# Patient Record
Sex: Male | Born: 1980
Health system: Southern US, Community
[De-identification: ages and names within clinical notes are randomized; demographics above are authoritative.]

---

## 2018-11-02 DIAGNOSIS — F909 Attention-deficit hyperactivity disorder, unspecified type: Secondary | ICD-10-CM | POA: Diagnosis not present

## 2018-11-06 DIAGNOSIS — H01004 Unspecified blepharitis left upper eyelid: Secondary | ICD-10-CM | POA: Diagnosis not present

## 2018-11-06 DIAGNOSIS — H01005 Unspecified blepharitis left lower eyelid: Secondary | ICD-10-CM | POA: Diagnosis not present

## 2018-11-06 DIAGNOSIS — H15102 Unspecified episcleritis, left eye: Secondary | ICD-10-CM | POA: Diagnosis not present

## 2018-11-16 DIAGNOSIS — Z713 Dietary counseling and surveillance: Secondary | ICD-10-CM | POA: Diagnosis not present

## 2018-11-16 DIAGNOSIS — F909 Attention-deficit hyperactivity disorder, unspecified type: Secondary | ICD-10-CM | POA: Diagnosis not present

## 2018-11-17 DIAGNOSIS — H16292 Other keratoconjunctivitis, left eye: Secondary | ICD-10-CM | POA: Diagnosis not present

## 2018-11-30 DIAGNOSIS — F909 Attention-deficit hyperactivity disorder, unspecified type: Secondary | ICD-10-CM | POA: Diagnosis not present

## 2018-12-14 DIAGNOSIS — F909 Attention-deficit hyperactivity disorder, unspecified type: Secondary | ICD-10-CM | POA: Diagnosis not present

## 2018-12-27 DIAGNOSIS — Z713 Dietary counseling and surveillance: Secondary | ICD-10-CM | POA: Diagnosis not present

## 2018-12-28 DIAGNOSIS — F909 Attention-deficit hyperactivity disorder, unspecified type: Secondary | ICD-10-CM | POA: Diagnosis not present

## 2019-01-11 DIAGNOSIS — F909 Attention-deficit hyperactivity disorder, unspecified type: Secondary | ICD-10-CM | POA: Diagnosis not present

## 2019-01-25 DIAGNOSIS — Z713 Dietary counseling and surveillance: Secondary | ICD-10-CM | POA: Diagnosis not present

## 2019-01-25 DIAGNOSIS — F909 Attention-deficit hyperactivity disorder, unspecified type: Secondary | ICD-10-CM | POA: Diagnosis not present

## 2019-02-06 DIAGNOSIS — K648 Other hemorrhoids: Secondary | ICD-10-CM | POA: Diagnosis not present

## 2019-02-06 DIAGNOSIS — H5712 Ocular pain, left eye: Secondary | ICD-10-CM | POA: Diagnosis not present

## 2019-02-08 DIAGNOSIS — F909 Attention-deficit hyperactivity disorder, unspecified type: Secondary | ICD-10-CM | POA: Diagnosis not present

## 2019-02-13 DIAGNOSIS — I788 Other diseases of capillaries: Secondary | ICD-10-CM | POA: Diagnosis not present

## 2019-02-22 DIAGNOSIS — F909 Attention-deficit hyperactivity disorder, unspecified type: Secondary | ICD-10-CM | POA: Diagnosis not present

## 2019-03-01 DIAGNOSIS — Z713 Dietary counseling and surveillance: Secondary | ICD-10-CM | POA: Diagnosis not present

## 2019-03-02 DIAGNOSIS — K921 Melena: Secondary | ICD-10-CM | POA: Diagnosis not present

## 2019-03-02 DIAGNOSIS — Z800 Family history of malignant neoplasm of digestive organs: Secondary | ICD-10-CM | POA: Diagnosis not present

## 2019-03-08 DIAGNOSIS — F909 Attention-deficit hyperactivity disorder, unspecified type: Secondary | ICD-10-CM | POA: Diagnosis not present

## 2019-03-21 DIAGNOSIS — K635 Polyp of colon: Secondary | ICD-10-CM | POA: Diagnosis not present

## 2019-03-21 DIAGNOSIS — K921 Melena: Secondary | ICD-10-CM | POA: Diagnosis not present

## 2019-03-21 DIAGNOSIS — Z8371 Family history of colonic polyps: Secondary | ICD-10-CM | POA: Diagnosis not present

## 2019-03-22 DIAGNOSIS — F909 Attention-deficit hyperactivity disorder, unspecified type: Secondary | ICD-10-CM | POA: Diagnosis not present

## 2019-04-03 DIAGNOSIS — Z713 Dietary counseling and surveillance: Secondary | ICD-10-CM | POA: Diagnosis not present

## 2019-04-05 DIAGNOSIS — F909 Attention-deficit hyperactivity disorder, unspecified type: Secondary | ICD-10-CM | POA: Diagnosis not present

## 2019-04-09 DIAGNOSIS — Z0000 Encounter for general adult medical examination without abnormal findings: Secondary | ICD-10-CM | POA: Diagnosis not present

## 2019-04-09 DIAGNOSIS — H15102 Unspecified episcleritis, left eye: Secondary | ICD-10-CM | POA: Diagnosis not present

## 2019-04-09 DIAGNOSIS — F419 Anxiety disorder, unspecified: Secondary | ICD-10-CM | POA: Diagnosis not present

## 2019-04-09 DIAGNOSIS — F330 Major depressive disorder, recurrent, mild: Secondary | ICD-10-CM | POA: Diagnosis not present

## 2019-04-19 DIAGNOSIS — F419 Anxiety disorder, unspecified: Secondary | ICD-10-CM | POA: Diagnosis not present

## 2019-04-19 DIAGNOSIS — Z0000 Encounter for general adult medical examination without abnormal findings: Secondary | ICD-10-CM | POA: Diagnosis not present

## 2019-04-19 DIAGNOSIS — F909 Attention-deficit hyperactivity disorder, unspecified type: Secondary | ICD-10-CM | POA: Diagnosis not present

## 2019-04-19 DIAGNOSIS — E291 Testicular hypofunction: Secondary | ICD-10-CM | POA: Diagnosis not present

## 2019-04-19 DIAGNOSIS — F341 Dysthymic disorder: Secondary | ICD-10-CM | POA: Diagnosis not present

## 2019-05-01 DIAGNOSIS — F411 Generalized anxiety disorder: Secondary | ICD-10-CM | POA: Diagnosis not present

## 2019-05-01 DIAGNOSIS — Z713 Dietary counseling and surveillance: Secondary | ICD-10-CM | POA: Diagnosis not present

## 2019-05-03 DIAGNOSIS — F909 Attention-deficit hyperactivity disorder, unspecified type: Secondary | ICD-10-CM | POA: Diagnosis not present

## 2019-05-17 DIAGNOSIS — F909 Attention-deficit hyperactivity disorder, unspecified type: Secondary | ICD-10-CM | POA: Diagnosis not present

## 2019-05-29 DIAGNOSIS — F411 Generalized anxiety disorder: Secondary | ICD-10-CM | POA: Diagnosis not present

## 2019-05-29 DIAGNOSIS — Z713 Dietary counseling and surveillance: Secondary | ICD-10-CM | POA: Diagnosis not present

## 2019-05-31 DIAGNOSIS — F909 Attention-deficit hyperactivity disorder, unspecified type: Secondary | ICD-10-CM | POA: Diagnosis not present

## 2019-06-20 DIAGNOSIS — H15002 Unspecified scleritis, left eye: Secondary | ICD-10-CM | POA: Diagnosis not present

## 2019-06-21 DIAGNOSIS — F909 Attention-deficit hyperactivity disorder, unspecified type: Secondary | ICD-10-CM | POA: Diagnosis not present

## 2019-06-26 DIAGNOSIS — Z713 Dietary counseling and surveillance: Secondary | ICD-10-CM | POA: Diagnosis not present

## 2019-07-05 DIAGNOSIS — H15102 Unspecified episcleritis, left eye: Secondary | ICD-10-CM | POA: Diagnosis not present

## 2019-07-12 DIAGNOSIS — F909 Attention-deficit hyperactivity disorder, unspecified type: Secondary | ICD-10-CM | POA: Diagnosis not present

## 2019-07-24 DIAGNOSIS — Z713 Dietary counseling and surveillance: Secondary | ICD-10-CM | POA: Diagnosis not present

## 2019-07-26 DIAGNOSIS — F909 Attention-deficit hyperactivity disorder, unspecified type: Secondary | ICD-10-CM | POA: Diagnosis not present

## 2019-08-09 DIAGNOSIS — F909 Attention-deficit hyperactivity disorder, unspecified type: Secondary | ICD-10-CM | POA: Diagnosis not present

## 2019-08-23 DIAGNOSIS — F909 Attention-deficit hyperactivity disorder, unspecified type: Secondary | ICD-10-CM | POA: Diagnosis not present

## 2019-08-28 DIAGNOSIS — F411 Generalized anxiety disorder: Secondary | ICD-10-CM | POA: Diagnosis not present

## 2019-08-30 DIAGNOSIS — Z713 Dietary counseling and surveillance: Secondary | ICD-10-CM | POA: Diagnosis not present

## 2019-09-06 DIAGNOSIS — F909 Attention-deficit hyperactivity disorder, unspecified type: Secondary | ICD-10-CM | POA: Diagnosis not present

## 2019-09-11 DIAGNOSIS — K648 Other hemorrhoids: Secondary | ICD-10-CM | POA: Diagnosis not present

## 2019-09-11 DIAGNOSIS — S6990XA Unspecified injury of unspecified wrist, hand and finger(s), initial encounter: Secondary | ICD-10-CM | POA: Diagnosis not present

## 2019-09-20 DIAGNOSIS — F909 Attention-deficit hyperactivity disorder, unspecified type: Secondary | ICD-10-CM | POA: Diagnosis not present

## 2019-09-27 DIAGNOSIS — Z713 Dietary counseling and surveillance: Secondary | ICD-10-CM | POA: Diagnosis not present

## 2019-10-18 DIAGNOSIS — F909 Attention-deficit hyperactivity disorder, unspecified type: Secondary | ICD-10-CM | POA: Diagnosis not present

## 2019-10-18 DIAGNOSIS — M79644 Pain in right finger(s): Secondary | ICD-10-CM | POA: Diagnosis not present

## 2019-11-01 DIAGNOSIS — F909 Attention-deficit hyperactivity disorder, unspecified type: Secondary | ICD-10-CM | POA: Diagnosis not present

## 2019-11-08 DIAGNOSIS — Z713 Dietary counseling and surveillance: Secondary | ICD-10-CM | POA: Diagnosis not present

## 2019-11-13 DIAGNOSIS — F411 Generalized anxiety disorder: Secondary | ICD-10-CM | POA: Diagnosis not present

## 2019-11-15 DIAGNOSIS — F909 Attention-deficit hyperactivity disorder, unspecified type: Secondary | ICD-10-CM | POA: Diagnosis not present

## 2019-11-16 DIAGNOSIS — H01005 Unspecified blepharitis left lower eyelid: Secondary | ICD-10-CM | POA: Diagnosis not present

## 2019-12-18 DIAGNOSIS — H1045 Other chronic allergic conjunctivitis: Secondary | ICD-10-CM | POA: Diagnosis not present

## 2020-01-21 DIAGNOSIS — H1045 Other chronic allergic conjunctivitis: Secondary | ICD-10-CM | POA: Diagnosis not present

## 2020-02-14 DIAGNOSIS — H1045 Other chronic allergic conjunctivitis: Secondary | ICD-10-CM | POA: Diagnosis not present

## 2020-03-04 DIAGNOSIS — F411 Generalized anxiety disorder: Secondary | ICD-10-CM | POA: Diagnosis not present

## 2020-03-27 DIAGNOSIS — H1045 Other chronic allergic conjunctivitis: Secondary | ICD-10-CM | POA: Diagnosis not present

## 2020-03-27 DIAGNOSIS — H16223 Keratoconjunctivitis sicca, not specified as Sjogren's, bilateral: Secondary | ICD-10-CM | POA: Diagnosis not present

## 2020-04-04 ENCOUNTER — Emergency Department (HOSPITAL_BASED_OUTPATIENT_CLINIC_OR_DEPARTMENT_OTHER)
Admission: EM | Admit: 2020-04-04 | Discharge: 2020-04-04 | Disposition: A | Discharge status: Home / Self Care | Payer: BC Managed Care – PPO | Attending: Emergency Medicine | Admitting: Emergency Medicine

## 2020-04-04 ENCOUNTER — Other Ambulatory Visit: Payer: Self-pay

## 2020-04-04 ENCOUNTER — Encounter (HOSPITAL_BASED_OUTPATIENT_CLINIC_OR_DEPARTMENT_OTHER): Payer: Self-pay

## 2020-04-04 ENCOUNTER — Emergency Department (HOSPITAL_BASED_OUTPATIENT_CLINIC_OR_DEPARTMENT_OTHER): Payer: BC Managed Care – PPO

## 2020-04-04 DIAGNOSIS — S60512A Abrasion of left hand, initial encounter: Secondary | ICD-10-CM | POA: Diagnosis not present

## 2020-04-04 DIAGNOSIS — Y92410 Unspecified street and highway as the place of occurrence of the external cause: Secondary | ICD-10-CM | POA: Insufficient documentation

## 2020-04-04 DIAGNOSIS — Z87891 Personal history of nicotine dependence: Secondary | ICD-10-CM | POA: Diagnosis not present

## 2020-04-04 DIAGNOSIS — Y999 Unspecified external cause status: Secondary | ICD-10-CM | POA: Insufficient documentation

## 2020-04-04 DIAGNOSIS — R0789 Other chest pain: Secondary | ICD-10-CM | POA: Diagnosis not present

## 2020-04-04 DIAGNOSIS — S6992XA Unspecified injury of left wrist, hand and finger(s), initial encounter: Secondary | ICD-10-CM | POA: Diagnosis not present

## 2020-04-04 DIAGNOSIS — S299XXA Unspecified injury of thorax, initial encounter: Secondary | ICD-10-CM | POA: Diagnosis not present

## 2020-04-04 DIAGNOSIS — Y9389 Activity, other specified: Secondary | ICD-10-CM | POA: Diagnosis not present

## 2020-04-04 MED ORDER — METHOCARBAMOL 500 MG PO TABS
500.0000 mg | ORAL_TABLET | Freq: Two times a day (BID) | ORAL | 0 refills | Status: DC
Start: 2020-04-04 — End: 2020-07-11
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
## 2020-04-04 NOTE — Discharge Instructions (Signed)

## 2020-04-09 DIAGNOSIS — H9312 Tinnitus, left ear: Secondary | ICD-10-CM | POA: Diagnosis not present

## 2020-04-15 DIAGNOSIS — M546 Pain in thoracic spine: Secondary | ICD-10-CM | POA: Diagnosis not present

## 2020-06-13 DIAGNOSIS — G8929 Other chronic pain: Secondary | ICD-10-CM | POA: Diagnosis not present

## 2020-06-13 DIAGNOSIS — M25572 Pain in left ankle and joints of left foot: Secondary | ICD-10-CM | POA: Diagnosis not present

## 2020-06-13 DIAGNOSIS — M25571 Pain in right ankle and joints of right foot: Secondary | ICD-10-CM | POA: Diagnosis not present

## 2020-06-16 ENCOUNTER — Other Ambulatory Visit: Payer: Self-pay | Admitting: Family Medicine

## 2020-06-16 ENCOUNTER — Ambulatory Visit
Admission: RE | Admit: 2020-06-16 | Discharge: 2020-06-16 | Disposition: A | Discharge status: Home / Self Care | Payer: BC Managed Care – PPO | Source: Ambulatory Visit | Attending: Family Medicine | Admitting: Family Medicine

## 2020-06-16 ENCOUNTER — Other Ambulatory Visit: Payer: Self-pay

## 2020-06-16 DIAGNOSIS — M25571 Pain in right ankle and joints of right foot: Secondary | ICD-10-CM | POA: Diagnosis not present

## 2020-06-16 DIAGNOSIS — R52 Pain, unspecified: Secondary | ICD-10-CM

## 2020-06-16 DIAGNOSIS — M25572 Pain in left ankle and joints of left foot: Secondary | ICD-10-CM | POA: Diagnosis not present

## 2020-06-19 DIAGNOSIS — I998 Other disorder of circulatory system: Secondary | ICD-10-CM | POA: Diagnosis not present

## 2020-06-24 DIAGNOSIS — M25572 Pain in left ankle and joints of left foot: Secondary | ICD-10-CM | POA: Diagnosis not present

## 2020-06-24 DIAGNOSIS — M25571 Pain in right ankle and joints of right foot: Secondary | ICD-10-CM | POA: Diagnosis not present

## 2020-07-01 DIAGNOSIS — M76822 Posterior tibial tendinitis, left leg: Secondary | ICD-10-CM | POA: Diagnosis not present

## 2020-07-01 DIAGNOSIS — M6281 Muscle weakness (generalized): Secondary | ICD-10-CM | POA: Diagnosis not present

## 2020-07-01 DIAGNOSIS — M25672 Stiffness of left ankle, not elsewhere classified: Secondary | ICD-10-CM | POA: Diagnosis not present

## 2020-07-11 ENCOUNTER — Ambulatory Visit: Payer: BC Managed Care – PPO | Admitting: Cardiology

## 2020-07-11 ENCOUNTER — Other Ambulatory Visit: Payer: Self-pay

## 2020-07-11 ENCOUNTER — Encounter: Payer: Self-pay | Admitting: Cardiology

## 2020-07-11 VITALS — BP 141/85 | HR 79 | Resp 17 | Ht 66.0 in | Wt 157.0 lb

## 2020-07-11 DIAGNOSIS — Z8249 Family history of ischemic heart disease and other diseases of the circulatory system: Secondary | ICD-10-CM

## 2020-07-11 DIAGNOSIS — R030 Elevated blood-pressure reading, without diagnosis of hypertension: Secondary | ICD-10-CM

## 2020-07-11 DIAGNOSIS — I998 Other disorder of circulatory system: Secondary | ICD-10-CM | POA: Diagnosis not present

## 2020-07-11 NOTE — Progress Notes (Signed)
Patient referred by Henrine Screws, MD for vascular calcification  Subjective:   Jose Perkins, male    DOB: November 28, 1981, 39 y.o.   MRN: [id number]   Chief Complaint  Patient presents with  . vascular calcification  . New Patient (Initial Visit)     HPI  39 year old male, former smoker, with family history of early CAD, referred for evaluation of vascular calcification.  Patient was recently seen by his PCP for complaints of ankle pain.  His regular runner.  Foot x-ray showed no significant ankle injury, but showed incidental finding of diffuse vascular calcification.  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  He is an avid runner, runs 25 miles a week.  Recently, he has had pain in both his ankles for which he was seeing his PCP.  This was thought to be tendon injury which has improved after some rest.  Work-up showed incidental finding of diffuse vascular calcification prompting referral to cardiology.  Patient also has very strong family history of coronary artery disease with multiple family members having had MI in their 91s.  He denies any chest pain, shortness of breath.  Also denies any claudication symptoms.  His feet pain is very localized to his Achilles tendon area.   History reviewed. No pertinent past medical history.   History reviewed. No pertinent surgical history.   Social History   Tobacco Use  Smoking Status Former Smoker  . Years: 5.00  Smokeless Tobacco Never Used    Social History   Substance and Sexual Activity  Alcohol Use Yes   Comment: social     Family History  Problem Relation Age of Onset  . Hypertension Mother   . Diabetes Mother   . Heart attack Father   . Drug abuse Brother      Current Outpatient Medications on File Prior to Visit  Medication Sig Dispense Refill  . GINSENG PO Take by mouth. tea    . Multiple Vitamin (MULTIVITAMIN) tablet Take 1 tablet by mouth  daily.    . NON FORMULARY Matcha green tea    . NON FORMULARY Ginseng Green Tea    . RESTASIS 0.05 % ophthalmic emulsion     . sertraline (ZOLOFT) 100 MG tablet Take 100 mg by mouth daily.    . TURMERIC CURCUMIN PO Take 1 tablet by mouth 2 (two) times daily.     No current facility-administered medications on file prior to visit.    Cardiovascular and other pertinent studies:  EKG 07/11/2020: Sinus rhythm 75 bpm Normal EKG   Foot Xray 06/16/2020: No acute bony abnormality noted. Diffuse vascular calcifications are seen. Correlate with any underlying medical abnormality.    Recent labs: 06/19/2020: Chol 157, TG 109, HDL 54, LDL 84   Review of Systems  Cardiovascular: Negative for chest pain, dyspnea on exertion, leg swelling, palpitations and syncope.  Musculoskeletal:       Pain in bilateral Achilles tendon area         Vitals:   07/11/20 1108  BP: (!) 141/85  Pulse: 79  Resp: 17  SpO2: 98%     Body mass index is 25.34 kg/m. Filed Weights   07/11/20 1108  Weight: 157 lb (71.2 kg)     Objective:   Physical Exam Vitals and nursing note reviewed.  Constitutional:      General: He is not in acute distress. Neck:     Vascular: No JVD.  Cardiovascular:  Rate and Rhythm: Normal rate and regular rhythm.     Pulses:          Posterior tibial pulses are 2+ on the right side and 2+ on the left side.     Heart sounds: Normal heart sounds. No murmur heard.      Comments: DP not well appreciated.  This could be an anatomical variant. Pulmonary:     Effort: Pulmonary effort is normal.     Breath sounds: Normal breath sounds. No wheezing or rales.            Assessment & Recommendations:   39 year old male, former smoker, with family history of early CAD, referred for evaluation of vascular calcification.  Vascular calcification is an incidental finding.  His symptoms are not consistent with claudication.  Nonetheless, I will obtain baseline ABI.   Also, given his strong family history of early CAD, I will obtain calcium score scan for stratification.  Blood pressure elevated today, but is usually normal.  Continue monitoring at home.  Further recommendations after above testing.  Follow-up as needed.  Thank you for referring the patient to [place]. Please feel free to contact with any questions.  Elder Negus, MD

## 2020-07-25 ENCOUNTER — Other Ambulatory Visit: Payer: BC Managed Care – PPO

## 2020-07-28 ENCOUNTER — Other Ambulatory Visit: Payer: BC Managed Care – PPO

## 2020-08-14 ENCOUNTER — Ambulatory Visit: Payer: BC Managed Care – PPO

## 2020-08-14 ENCOUNTER — Other Ambulatory Visit: Payer: Self-pay

## 2020-08-14 DIAGNOSIS — I998 Other disorder of circulatory system: Secondary | ICD-10-CM

## 2020-08-18 NOTE — Progress Notes (Signed)
Spoke to patient, pt is aware

## 2020-09-02 DIAGNOSIS — F411 Generalized anxiety disorder: Secondary | ICD-10-CM | POA: Diagnosis not present

## 2020-09-18 DIAGNOSIS — M9905 Segmental and somatic dysfunction of pelvic region: Secondary | ICD-10-CM | POA: Diagnosis not present

## 2020-09-18 DIAGNOSIS — M5386 Other specified dorsopathies, lumbar region: Secondary | ICD-10-CM | POA: Diagnosis not present

## 2020-09-18 DIAGNOSIS — M9903 Segmental and somatic dysfunction of lumbar region: Secondary | ICD-10-CM | POA: Diagnosis not present

## 2020-09-18 DIAGNOSIS — M25552 Pain in left hip: Secondary | ICD-10-CM | POA: Diagnosis not present

## 2020-09-29 DIAGNOSIS — M47816 Spondylosis without myelopathy or radiculopathy, lumbar region: Secondary | ICD-10-CM | POA: Diagnosis not present

## 2020-11-25 DIAGNOSIS — F411 Generalized anxiety disorder: Secondary | ICD-10-CM | POA: Diagnosis not present

## 2020-12-02 DIAGNOSIS — F411 Generalized anxiety disorder: Secondary | ICD-10-CM | POA: Diagnosis not present

## 2020-12-19 DIAGNOSIS — F411 Generalized anxiety disorder: Secondary | ICD-10-CM | POA: Diagnosis not present

## 2021-02-11 DIAGNOSIS — F411 Generalized anxiety disorder: Secondary | ICD-10-CM | POA: Diagnosis not present

## 2021-02-12 DIAGNOSIS — R58 Hemorrhage, not elsewhere classified: Secondary | ICD-10-CM | POA: Diagnosis not present

## 2021-02-25 IMAGING — CR DG ANKLE COMPLETE 3+V*R*
3 series · 3 of 3 positions shown · non-contrast
Comparison: None.

CLINICAL DATA: Posterior ankle pain, no known injury, initial
encounter

EXAM:
RIGHT ANKLE - COMPLETE 3+ VIEW

[x ankle ap right]
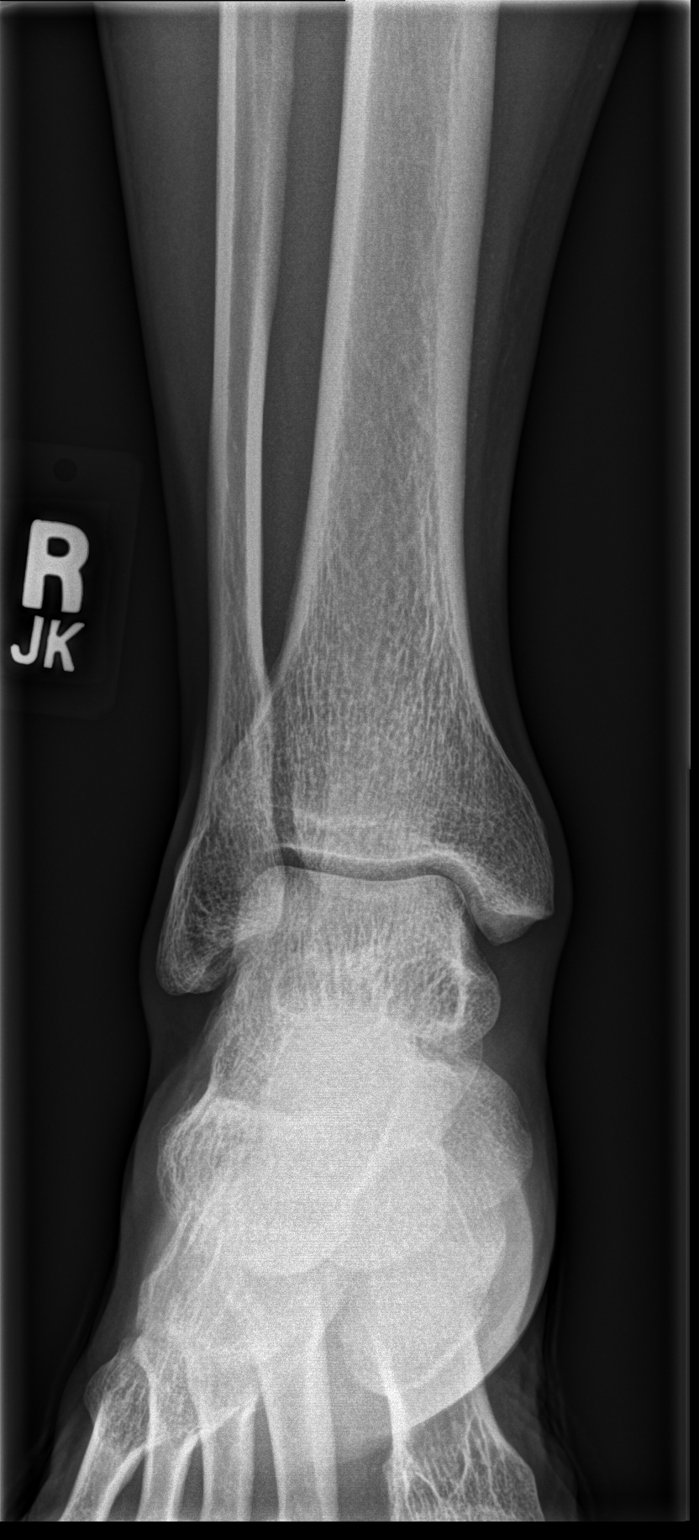

[x ankle obl right]
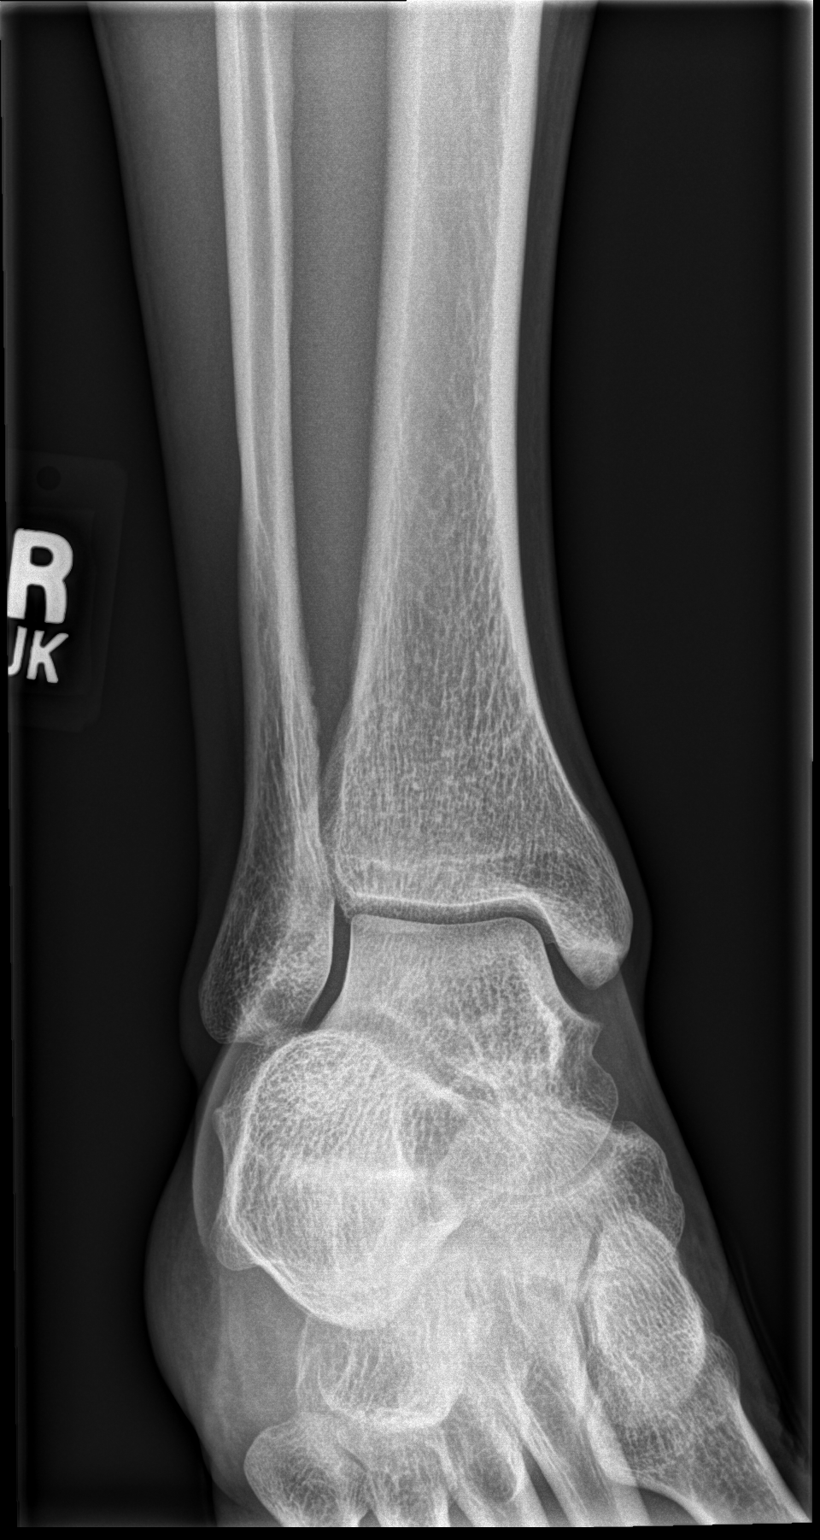

[x ankle lat right]
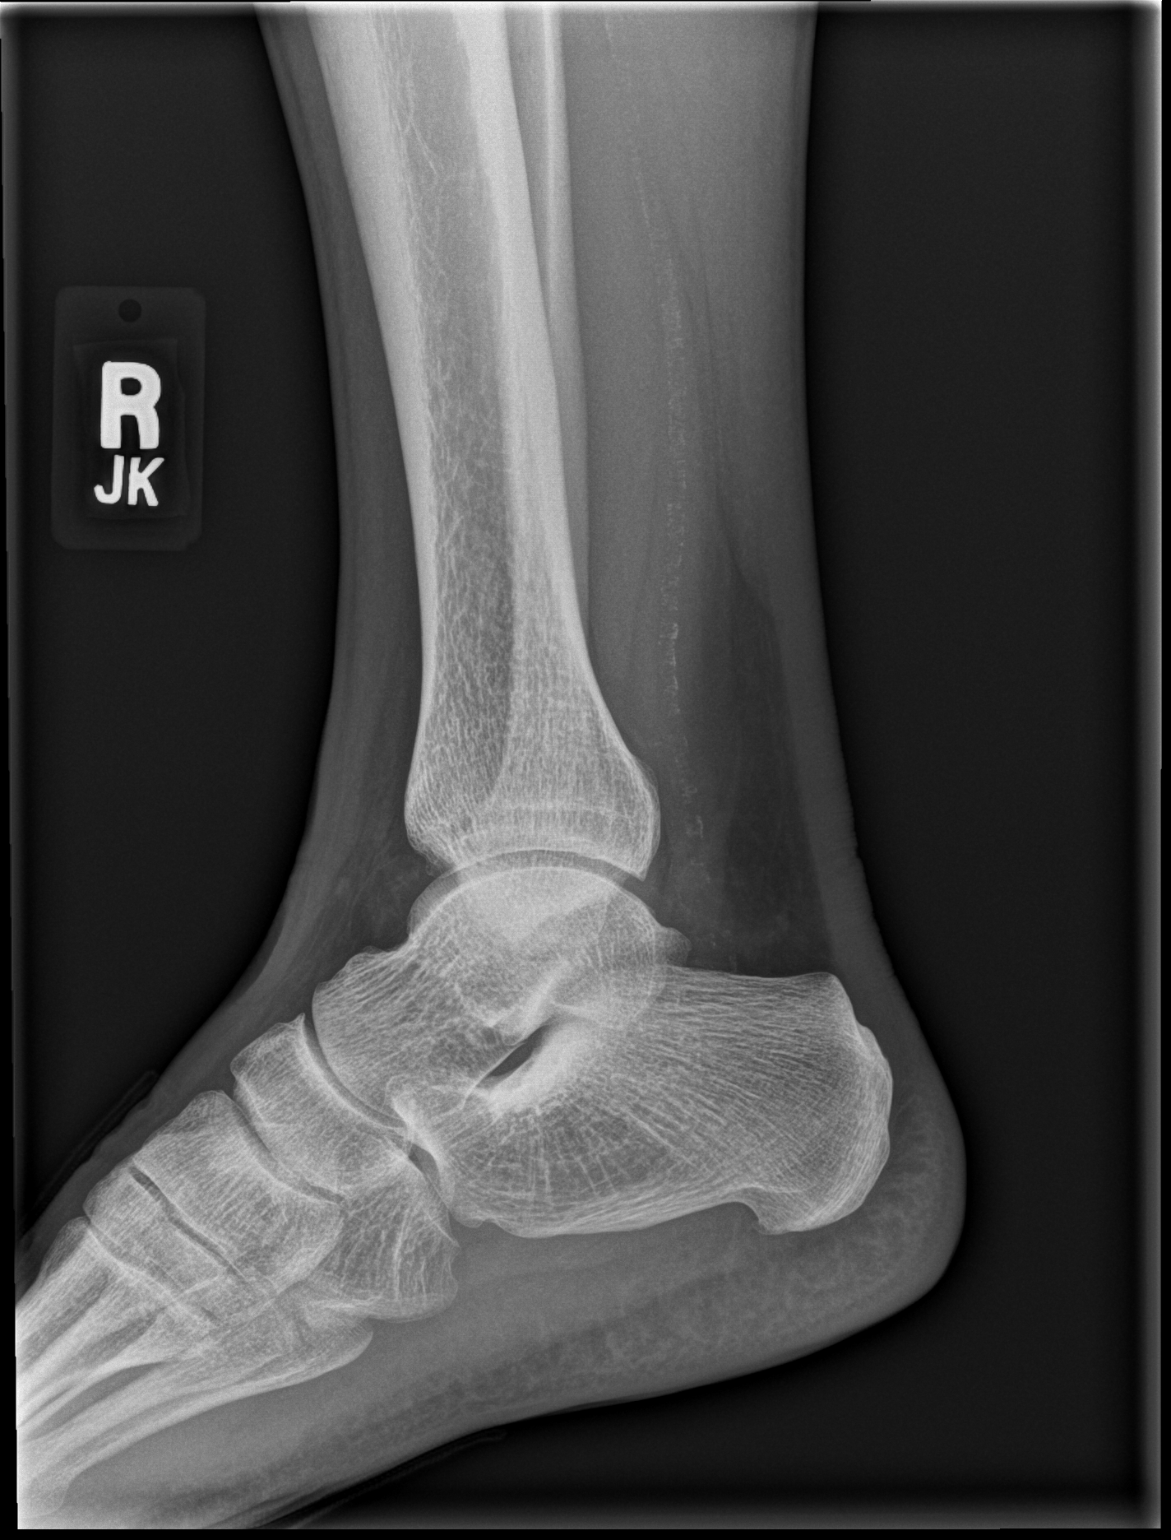

[3 of 3 positions shown; findings below may reference images not displayed]

FINDINGS: No acute fracture or dislocation is noted. Diffuse vascular
calcifications are noted somewhat unusual for a patient of this age.
No other focal abnormality is seen.
IMPRESSION: No acute bony abnormality noted.

Vascular calcifications as described. Correlate with any underlying
medical abnormality.

## 2021-02-25 IMAGING — CR DG ANKLE COMPLETE 3+V*L*
3 series · 3 of 3 positions shown · non-contrast
Comparison: None.

CLINICAL DATA: Posterior ankle pain, no known injury, initial
encounter

EXAM:
LEFT ANKLE COMPLETE - 3+ VIEW

[x ankle ap left]
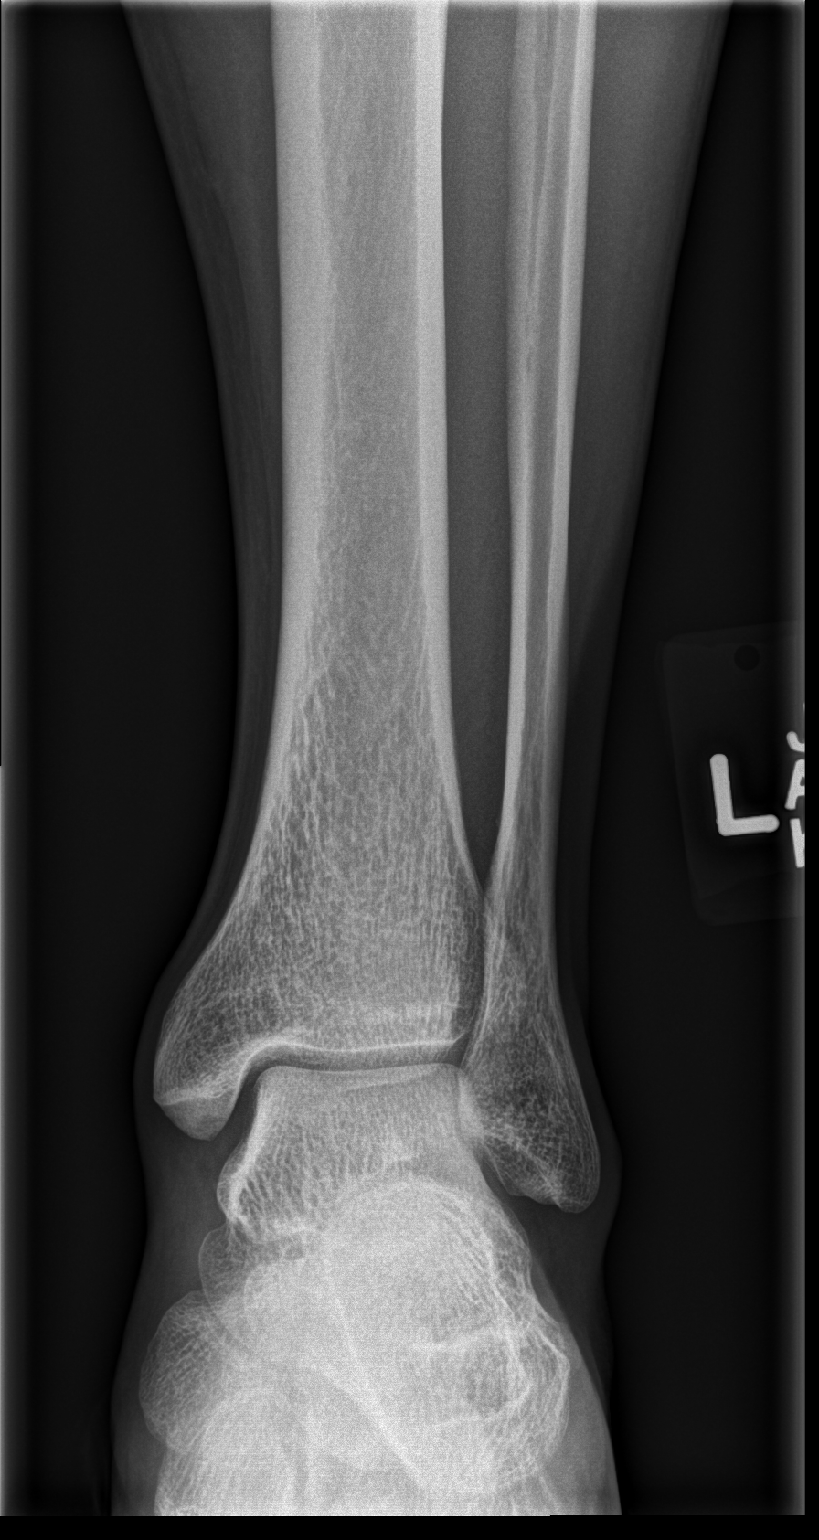

[x ankle obl left]
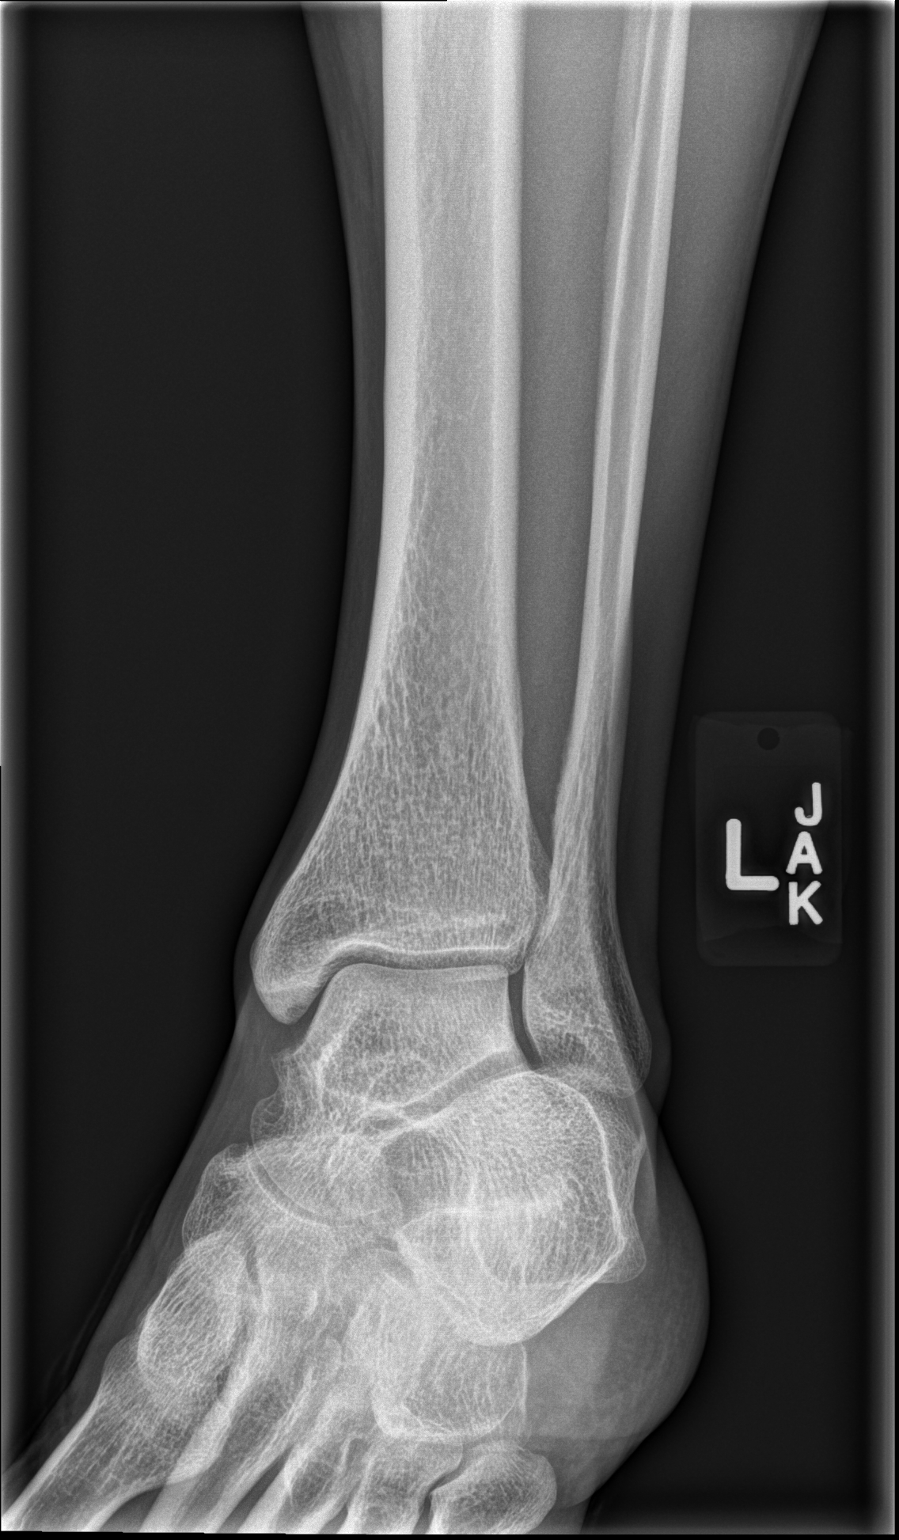

[x ankle lat left]
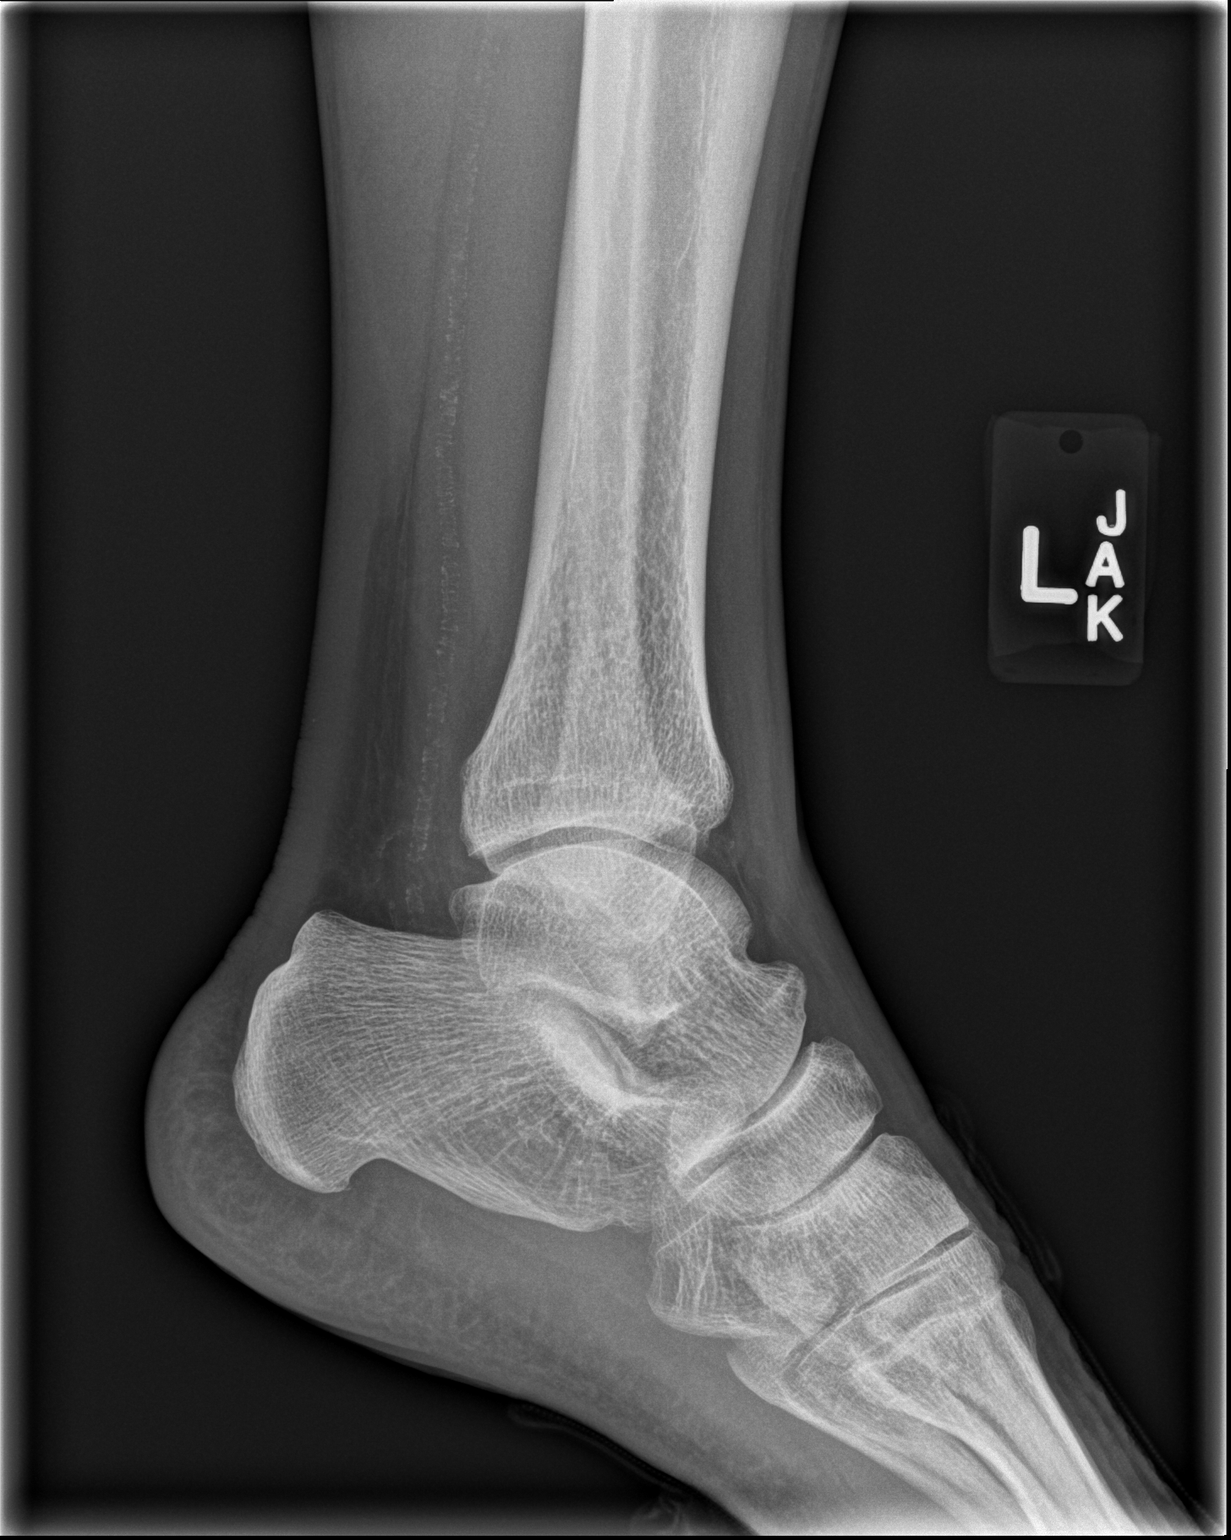

[3 of 3 positions shown; findings below may reference images not displayed]

FINDINGS: No acute fracture or dislocation is noted. Diffuse vascular
calcifications are seen somewhat unusual for patient of this age.
IMPRESSION: No acute bony abnormality noted.

Diffuse vascular calcifications are seen. Correlate with any
underlying medical abnormality.

## 2021-04-20 DIAGNOSIS — F411 Generalized anxiety disorder: Secondary | ICD-10-CM | POA: Diagnosis not present

## 2021-05-11 DIAGNOSIS — H1045 Other chronic allergic conjunctivitis: Secondary | ICD-10-CM | POA: Diagnosis not present

## 2021-05-11 DIAGNOSIS — H16423 Pannus (corneal), bilateral: Secondary | ICD-10-CM | POA: Diagnosis not present

## 2021-05-11 DIAGNOSIS — H16223 Keratoconjunctivitis sicca, not specified as Sjogren's, bilateral: Secondary | ICD-10-CM | POA: Diagnosis not present

## 2021-05-20 DIAGNOSIS — G510 Bell's palsy: Secondary | ICD-10-CM | POA: Diagnosis not present

## 2021-06-09 DIAGNOSIS — F411 Generalized anxiety disorder: Secondary | ICD-10-CM | POA: Diagnosis not present

## 2021-08-28 DIAGNOSIS — F411 Generalized anxiety disorder: Secondary | ICD-10-CM | POA: Diagnosis not present
